# Patient Record
Sex: Female | Born: 1980 | Hispanic: Yes | Marital: Single | Smoking: Never smoker
Health system: Southern US, Community
[De-identification: ages and names within clinical notes are randomized; demographics above are authoritative.]

## PROBLEM LIST (undated history)

## (undated) DIAGNOSIS — Z789 Other specified health status: Secondary | ICD-10-CM

## (undated) HISTORY — PX: OTHER SURGICAL HISTORY: SHX169

---

## 2009-09-08 ENCOUNTER — Emergency Department: Payer: Self-pay | Admitting: Emergency Medicine

## 2009-12-23 ENCOUNTER — Encounter: Payer: Self-pay | Admitting: Obstetrics & Gynecology

## 2010-01-02 ENCOUNTER — Encounter: Payer: Self-pay | Admitting: Obstetrics & Gynecology

## 2010-05-19 ENCOUNTER — Inpatient Hospital Stay: Payer: Self-pay | Admitting: Obstetrics and Gynecology

## 2015-02-06 ENCOUNTER — Other Ambulatory Visit: Payer: Self-pay | Admitting: Advanced Practice Midwife

## 2015-02-06 DIAGNOSIS — Z369 Encounter for antenatal screening, unspecified: Secondary | ICD-10-CM

## 2015-03-03 ENCOUNTER — Encounter: Payer: Self-pay | Admitting: Emergency Medicine

## 2015-03-03 ENCOUNTER — Emergency Department: Payer: Medicaid Other

## 2015-03-03 ENCOUNTER — Emergency Department
Admission: EM | Admit: 2015-03-03 | Discharge: 2015-03-03 | Disposition: A | Discharge status: Home / Self Care | Payer: Medicaid Other | Attending: Emergency Medicine | Admitting: Emergency Medicine

## 2015-03-03 DIAGNOSIS — O209 Hemorrhage in early pregnancy, unspecified: Secondary | ICD-10-CM

## 2015-03-03 DIAGNOSIS — O039 Complete or unspecified spontaneous abortion without complication: Secondary | ICD-10-CM | POA: Diagnosis not present

## 2015-03-03 DIAGNOSIS — Z3A10 10 weeks gestation of pregnancy: Secondary | ICD-10-CM | POA: Diagnosis not present

## 2015-03-03 DIAGNOSIS — N939 Abnormal uterine and vaginal bleeding, unspecified: Secondary | ICD-10-CM

## 2015-03-03 DIAGNOSIS — R102 Pelvic and perineal pain: Secondary | ICD-10-CM

## 2015-03-03 DIAGNOSIS — O26899 Other specified pregnancy related conditions, unspecified trimester: Secondary | ICD-10-CM

## 2015-03-03 LAB — CBC
HCT: 40.1 % (ref 35.0–47.0)
HEMOGLOBIN: 13.4 g/dL (ref 12.0–16.0)
MCH: 30.3 pg (ref 26.0–34.0)
MCHC: 33.5 g/dL (ref 32.0–36.0)
MCV: 90.3 fL (ref 80.0–100.0)
PLATELETS: 227 10*3/uL (ref 150–440)
RBC: 4.44 MIL/uL (ref 3.80–5.20)
RDW: 13.3 % (ref 11.5–14.5)
WBC: 10.3 10*3/uL (ref 3.6–11.0)

## 2015-03-03 LAB — ABO/RH: ABO/RH(D): O POS

## 2015-03-03 LAB — URINALYSIS COMPLETE WITH MICROSCOPIC (ARMC ONLY)
BILIRUBIN URINE: NEGATIVE
Glucose, UA: NEGATIVE mg/dL
KETONES UR: NEGATIVE mg/dL
LEUKOCYTES UA: NEGATIVE
Nitrite: NEGATIVE
PH: 6 (ref 5.0–8.0)
PROTEIN: 100 mg/dL — AB
Specific Gravity, Urine: 1.006 (ref 1.005–1.030)

## 2015-03-03 LAB — POCT PREGNANCY, URINE: Preg Test, Ur: POSITIVE — AB

## 2015-03-03 LAB — TYPE AND SCREEN
ABO/RH(D): O POS
ANTIBODY SCREEN: NEGATIVE

## 2015-03-03 LAB — HCG, QUANTITATIVE, PREGNANCY: HCG, BETA CHAIN, QUANT, S: 12923 m[IU]/mL — AB (ref <5)

## 2015-03-03 NOTE — ED Notes (Signed)
Patient speaks minimal English.  Pt understood that we will be paging interpreter when MD is ready.

## 2015-03-03 NOTE — ED Notes (Signed)
Resources given to follow up with OB/GYN or health department. Interpreter at bedside for clarification of instructions.

## 2015-03-03 NOTE — ED Notes (Signed)
POCT UA Preg POSITVE

## 2015-03-03 NOTE — ED Notes (Signed)
Pt reports she is approximately [redacted] weeks pregnant and yesterday she noticed some vaginal discharge with blood. Pt reports today she saw a blood clot, pt reports lower abdominal pain. Pt denies any other symptom at present. Pt talks in complete sentences no respiratory distress noted.

## 2015-03-03 NOTE — ED Provider Notes (Signed)
[facility] Emergency Department Provider Note  ____________________________________________  Time seen: 1:45pm  I have reviewed the triage vital signs and the nursing notes.   HISTORY  Chief Complaint Vaginal Bleeding seen with spanish interp   HPI Brianna Conner is a 34 y.o. female who complains of vag bleeding since yesterday.  Also cramping pelvic pain.  No f/c/n/v/cp/sob. No dizzy/syncope. Colicky pain, mod. Intensity.    pnc at health dept. Take pnv.      History reviewed. No pertinent past medical history.   There are no active problems to display for this patient.    Past Surgical History  Procedure Laterality Date  . Arm surgery  Right      No current outpatient prescriptions on file. Prenatal vits.   Allergies Review of patient's allergies indicates not on file.   No family history on file.  Social History Social History  Substance Use Topics  . Smoking status: Never Smoker   . Smokeless tobacco: None  . Alcohol Use: No    Review of Systems  Constitutional:   No fever or chills. No weight changes Eyes:   No blurry vision or double vision.  ENT:   No sore throat. Cardiovascular:   No chest pain. Respiratory:   No dyspnea or cough. Gastrointestinal:   Pelvic pain without vomiting and diarrhea.  No BRBPR or melena. Genitourinary:   Negative for dysuria, urinary retention, bloody urine, or difficulty urinating. Musculoskeletal:   Negative for back pain. No joint swelling or pain. Skin:   Negative for rash. Neurological:   Negative for headaches, focal weakness or numbness. Psychiatric:  No anxiety or depression.   Endocrine:  No hot/cold intolerance, changes in energy, or sleep difficulty.  10-point ROS otherwise negative.  ____________________________________________   PHYSICAL EXAM:  VITAL SIGNS: ED Triage Vitals  Enc Vitals Group     BP 03/03/15 1126 123/76 mmHg     Pulse Rate 03/03/15 1126 80      Resp 03/03/15 1126 20     Temp 03/03/15 1126 97.8 F (36.6 C)     Temp Source 03/03/15 1126 Oral     SpO2 03/03/15 1126 99 %     Weight 03/03/15 1126 155 lb (70.308 kg)     Height 03/03/15 1126  (1.6 m)     Head Cir --      Peak Flow --      Pain Score 03/03/15 1127 5     Pain Loc --      Pain Edu? --      Excl. in GC? --      Constitutional:   Alert and oriented. Well appearing and in no distress. Eyes:   No scleral icterus. No conjunctival pallor. PERRL. EOMI ENT   Head:   Normocephalic and atraumatic.   Nose:   No congestion/rhinnorhea. No septal hematoma   Mouth/Throat:   MMM, no pharyngeal erythema. No peritonsillar mass. No uvula shift.   Neck:   No stridor. No SubQ emphysema. No meningismus. Hematological/Lymphatic/Immunilogical:   No cervical lymphadenopathy. Cardiovascular:   RRR. Normal and symmetric distal pulses are present in all extremities. No murmurs, rubs, or gallops. Respiratory:   Normal respiratory effort without tachypnea nor retractions. Breath sounds are clear and equal bilaterally. No wheezes/rales/rhonchi. Gastrointestinal:   Soft with diffuse lower abd pain. Worse suprapubic. Marland Kitchen No distention. There is no CVA tenderness.  No rebound, rigidity, or guarding. Genitourinary:   deferred Musculoskeletal:   Nontender with normal range of motion  in all extremities. No joint effusions.  No lower extremity tenderness.  No edema. Neurologic:   Normal speech and language.  CN 2-10 normal. Motor grossly intact. No pronator drift.  Normal gait. No gross focal neurologic deficits are appreciated.  Skin:    Skin is warm, dry and intact. No rash noted.  No petechiae, purpura, or bullae. Psychiatric:   Mood and affect are normal. Speech and behavior are normal. Patient exhibits appropriate insight and judgment.  ____________________________________________    LABS (pertinent positives/negatives) (all labs ordered are listed, but only abnormal  results are displayed) Labs Reviewed  URINALYSIS COMPLETEWITH MICROSCOPIC ([facility] ONLY) - Abnormal; Notable for the following:    Color, Urine RED (*)    APPearance CLEAR (*)    Hgb urine dipstick 3+ (*)    Protein, ur 100 (*)    Bacteria, UA RARE (*)    Squamous Epithelial / LPF 0-5 (*)    All other components within normal limits  POCT PREGNANCY, URINE - Abnormal; Notable for the following:    Preg Test, Ur POSITIVE (*)    All other components within normal limits  CBC  HCG, QUANTITATIVE, PREGNANCY  POC URINE PREG, ED  TYPE AND SCREEN  ABO/RH   ____________________________________________   EKG    ____________________________________________    RADIOLOGY  US OB/pelvis pending  ____________________________________________   PROCEDURES   ____________________________________________   INITIAL IMPRESSION / ASSESSMENT AND PLAN / ED COURSE  Pertinent labs & imaging results that were available during my care of the patient were reviewed by me and considered in my medical decision making (see chart for details).  Pt p/w vaginal bleeding in second trimester. No prior US this preg.  Will start with [place] to r/o previa, f/u OB if live IUP.  Type is 0+, no rhogam needed.    Care of pt signed out to oncoming physician Dr. Shaune Pollack at 15:00     ____________________________________________   FINAL CLINICAL IMPRESSION(S) / ED DIAGNOSES  Final diagnoses:  Vaginal bleeding before [redacted] weeks gestation      Sharman Cheek, MD 03/03/15 1506

## 2015-03-03 NOTE — ED Provider Notes (Addendum)
Radiology  Ultrasound pelvic:  IMPRESSION: Large misshapen intrauterine gestational sac without demonstrated fetal pole. Findings meet definitive criteria for failed pregnancy. This follows SRU consensus guidelines: Diagnostic Criteria for Nonviable Pregnancy Early in the First Trimester. [facility] [phone].    I discussed the result of failed pregnancy with the patient and family. We discussed return precautions with respect to bleeding and pain. Patient was referred to follow-up with either the health department or OB/GYN.    Governor Rooks, MD 03/03/15 1705  Governor Rooks, MD 03/03/15 [phone]

## 2015-03-03 NOTE — ED Notes (Signed)
Patient reports yesterday she had a little bit of blood yesterday and today she started noticing rings that looked like rubber coming from vagina and was increasing. She is seen at the health department for prenatal care. Currently taking prenatal vitamins.  She has not had any ultrasounds done yet.  C/o pain in lower abdomen with palpation.

## 2015-03-03 NOTE — Discharge Instructions (Signed)
Aborto incompleto °(Incomplete Miscarriage) °Un aborto espontáneo es la pérdida repentina de un bebé en gestación (feto) antes de la semana 20 del embarazo. En un aborto espontáneo, partes del feto o la placenta (alumbramiento) permanecen en el cuerpo.  °El aborto espontáneo puede ser una experiencia que afecte emocionalmente a la persona. Hable con su médico si tiene preguntas sobre el aborto espontáneo, el proceso de duelo y los planes futuros de embarazo. °CAUSAS  °· Algunos problemas cromosómicos pueden hacer imposible que el bebé se desarrolle normalmente. Los problemas con los genes o cromosomas del bebé son, en la mayoría de los casos, el resultado de errores que se producen, al azar, cuando el embrión se divide y crece. Estos problemas no se heredan de los padres. °· Infección en el cuello del útero. °· Problemas hormonales. °· Problemas en el cuello del útero, como tener un útero incompetente. Esto ocurre cuando los tejidos no son lo suficientemente fuertes como para contener el embarazo. °· Problemas del útero, como un útero con forma anormal, los fibromas o anormalidades congénitas. °· Ciertas enfermedades crónicas. °· No fume, no beba alcohol, ni consuma drogas. °· Traumatismos. °SÍNTOMAS  °· Sangrado o manchado vaginal, con o sin cólicos o dolor. °· Dolor o cólicos en el abdomen o en la cintura. °· Eliminación de líquido, tejidos o coágulos grandes por la vagina. °DIAGNÓSTICO  °El médico le hará un examen físico. También le indicará una ecografía para confirmar el aborto. Es posible que se realicen análisis de sangre. °TRATAMIENTO  °· Generalmente se realiza un procedimiento de dilatación y curetaje (D y C). Durante el procedimiento de dilatación y curetaje, el cuello del útero se abre (dilata) y se retira todo resto de tejido fetal o placentario del útero. °· Si hay una infección, le recetarán antibióticos. Posiblemente le receten otros medicamentos para reducir (contraer) el tamaño del útero si hay  mucha hemorragia. °· Si su tipo de sangre es Rh negativo y el del bebé es Rh positivo, necesitará una inyección de inmunoglobulina Rho(D). Esta inyección protegerá a los futuros bebés de tener problemas de compatibilidad Rh en futuros embarazos. °· Probablemente le indiquen reposo. Esto significa que debe quedarse en cama y levantarse únicamente para ir al baño. °INSTRUCCIONES PARA EL CUIDADO EN EL HOGAR  °· Haga reposo según las indicaciones del médico. °· Limite las actividades según las indicaciones del médico. Es posible que se le permita retomar las actividades livianas si no se le realizó un curetaje, pero necesitará tratamiento adicional. °· Lleve un registro de la cantidad de toallas sanitarias que usa por día. Observe cuán impregnadas (saturadas) están. Registre esta información. °· No  use tampones. °· No se haga duchas vaginales ni tenga relaciones sexuales hasta que el médico la autorice. °· Asista a todas las citas de seguimiento para una nueva evaluación y para continuar el tratamiento. °· Sólo tome medicamentos de venta libre o recetados para calmar el dolor, el malestar o bajar la fiebre, según las indicaciones de su médico. °· Tome los antibióticos como le indicó el médico. Asegúrese de que finaliza la prescripción completa aunque se sienta mejor. °SOLICITE ATENCIÓN MÉDICA DE INMEDIATO SI:  °· Siente calambres intensos en el estómago, en la espalda o en el abdomen. °· Le sube la fiebre sin motivo (asegúrese de registrar las cifras). °· Elimina coágulos grandes o tejidos (consérvelos para que el médico los analice). °· La hemorragia aumenta. °· Se siente mareada, débil o tiene episodios de desmayo. °ASEGÚRESE DE QUE:  °· Comprende estas instrucciones. °·   Controlará su afección. °· Recibirá ayuda de inmediato si no mejora o si empeora. °Document Released: 05/25/2005 Document Revised: 03/15/2013 °ExitCare® Patient Information ©2015 [employer]. This information is not intended to replace advice given  to you by your health care provider. Make sure you discuss any questions you have with your health care provider. ° °

## 2015-03-06 ENCOUNTER — Encounter: Payer: Self-pay | Admitting: Emergency Medicine

## 2015-03-06 ENCOUNTER — Emergency Department
Admission: EM | Admit: 2015-03-06 | Discharge: 2015-03-06 | Disposition: A | Discharge status: Home / Self Care | Payer: Medicaid Other | Attending: Emergency Medicine | Admitting: Emergency Medicine

## 2015-03-06 DIAGNOSIS — Z3A11 11 weeks gestation of pregnancy: Secondary | ICD-10-CM | POA: Diagnosis not present

## 2015-03-06 DIAGNOSIS — O039 Complete or unspecified spontaneous abortion without complication: Secondary | ICD-10-CM | POA: Insufficient documentation

## 2015-03-06 DIAGNOSIS — O209 Hemorrhage in early pregnancy, unspecified: Secondary | ICD-10-CM | POA: Diagnosis present

## 2015-03-06 LAB — URINALYSIS COMPLETE WITH MICROSCOPIC (ARMC ONLY)
Bacteria, UA: NONE SEEN
SPECIFIC GRAVITY, URINE: 1.018 (ref 1.005–1.030)

## 2015-03-06 LAB — COMPREHENSIVE METABOLIC PANEL
ALT: 16 U/L (ref 14–54)
ANION GAP: 10 (ref 5–15)
AST: 31 U/L (ref 15–41)
Albumin: 3.7 g/dL (ref 3.5–5.0)
Alkaline Phosphatase: 71 U/L (ref 38–126)
BUN: 12 mg/dL (ref 6–20)
CHLORIDE: 104 mmol/L (ref 101–111)
CO2: 22 mmol/L (ref 22–32)
CREATININE: 0.63 mg/dL (ref 0.44–1.00)
Calcium: 9.4 mg/dL (ref 8.9–10.3)
Glucose, Bld: 121 mg/dL — ABNORMAL HIGH (ref 65–99)
POTASSIUM: 3.8 mmol/L (ref 3.5–5.1)
SODIUM: 136 mmol/L (ref 135–145)
Total Bilirubin: 0.4 mg/dL (ref 0.3–1.2)
Total Protein: 7.4 g/dL (ref 6.5–8.1)

## 2015-03-06 LAB — CBC WITH DIFFERENTIAL/PLATELET
Basophils Absolute: 0.1 10*3/uL (ref 0–0.1)
Basophils Relative: 1 %
EOS ABS: 0.1 10*3/uL (ref 0–0.7)
Eosinophils Relative: 0 %
HEMATOCRIT: 38.8 % (ref 35.0–47.0)
HEMOGLOBIN: 12.9 g/dL (ref 12.0–16.0)
LYMPHS ABS: 1.5 10*3/uL (ref 1.0–3.6)
LYMPHS PCT: 11 %
MCH: 30.4 pg (ref 26.0–34.0)
MCHC: 33.3 g/dL (ref 32.0–36.0)
MCV: 91.3 fL (ref 80.0–100.0)
Monocytes Absolute: 0.5 10*3/uL (ref 0.2–0.9)
Monocytes Relative: 3 %
NEUTROS ABS: 11.9 10*3/uL — AB (ref 1.4–6.5)
NEUTROS PCT: 85 %
Platelets: 238 10*3/uL (ref 150–440)
RBC: 4.25 MIL/uL (ref 3.80–5.20)
RDW: 13.4 % (ref 11.5–14.5)
WBC: 14.1 10*3/uL — AB (ref 3.6–11.0)

## 2015-03-06 LAB — HCG, QUANTITATIVE, PREGNANCY: HCG, BETA CHAIN, QUANT, S: 4866 m[IU]/mL — AB (ref <5)

## 2015-03-06 MED ORDER — SODIUM CHLORIDE 0.9 % IV BOLUS (SEPSIS)
500.0000 mL | Freq: Once | INTRAVENOUS | Status: AC
  Administered 2015-03-06: 500 mL via INTRAVENOUS

## 2015-03-06 NOTE — ED Notes (Signed)
Called lab again to verify blood work received.

## 2015-03-06 NOTE — ED Notes (Signed)
Pt to ed from health department for heavy bleeding,  Per health dept, pt was told she was having a miscarriage on the 25th of sept.  Pt went there today for follow up with heavy vaginal bleeding, soaking through two pads per hour and severe pain.

## 2015-03-06 NOTE — ED Notes (Signed)
Patient reports seeing health department today after heavy bleeding (2 pads per hour). Was told on 9/25 she was having miscarriage. Patient states she was approximately [redacted] weeks pregnant. Patient states she has very minimal pain currently.

## 2015-03-06 NOTE — ED Provider Notes (Addendum)
[facility] Emergency Department Provider Note  ____________________________________________  Time seen: Approximately 4:28 PM  I have reviewed the triage vital signs and the nursing notes.   HISTORY  Chief Complaint Vaginal Bleeding    HPI Brianna Conner is a 34 y.o. female who presents today complaining of having had vaginal bleeding earlier today. Patient was seen here on the 25th of this month, 3 days ago, and diagnosed with fetal demise. She is G2 P1 and her last missed her period was July 4 making her 12 and 2 at this time. She was sent home with bleeding precautions and was fine until this morning when she went to her primary care doctor. They did a pelvic exam and the patient thereafter began to bleed. The patient stated that she had significant bleeding approximately 8 pads an hour passed a few large clots but now the bleeding is stopped or nearly so and the cramping is nearly gone. She was sent here because it was thought that she was having a miscarriage.      History reviewed. No pertinent past medical history.  There are no active problems to display for this patient.   Past Surgical History  Procedure Laterality Date  . Arm surgery  Right     No current outpatient prescriptions on file.  Allergies Review of patient's allergies indicates no known allergies.  History reviewed. No pertinent family history.  Social History Social History  Substance Use Topics  . Smoking status: Never Smoker   . Smokeless tobacco: None  . Alcohol Use: No    Review of Systems Constitutional: No fever/chills Eyes: No visual changes. ENT: No sore throat. No stiff neck no neck pain Cardiovascular: Denies chest pain. Respiratory: Denies shortness of breath. Gastrointestinal:   no vomiting.  No diarrhea.  No constipation. Genitourinary: Negative for dysuria. Musculoskeletal: Negative lower extremity swelling Skin: Negative for  rash. Neurological: Negative for headaches, focal weakness or numbness. 10-point ROS otherwise negative.  ____________________________________________   PHYSICAL EXAM:  VITAL SIGNS: ED Triage Vitals  Enc Vitals Group     BP 03/06/15 1144 112/75 mmHg     Pulse Rate 03/06/15 1144 100     Resp 03/06/15 1144 20     Temp 03/06/15 1144 98.2 F (36.8 C)     Temp Source 03/06/15 1144 Oral     SpO2 03/06/15 1144 100 %     Weight --      Height --      Head Cir --      Peak Flow --      Pain Score 03/06/15 1145 9     Pain Loc --      Pain Edu? --      Excl. in GC? --     Constitutional: Alert and oriented. Well appearing and in no acute distress. Eyes: Conjunctivae are normal. PERRL. EOMI. Head: Atraumatic. Nose: No congestion/rhinnorhea. Mouth/Throat: Mucous membranes are moist.  Oropharynx non-erythematous. Neck: No stridor.   Nontender with no meningismus Cardiovascular: Normal rate, regular rhythm. Grossly normal heart sounds.  Good peripheral circulation. Respiratory: Normal respiratory effort.  No retractions. Lungs CTAB. Gastrointestinal: Soft and  Mildly tender to palpation in the lower abdomen, no guarding no rebound s no abdominal distention not a surgical abdomen no evidence of hemoperitoneum   Back:  There is no focal tenderness or step off there is no midline tenderness there are no lesions noted. there is ntenderness  pelvic exam:  female chaperone present, there is scant  amounts of blood in the vault, her os is open to fingertip, there is no adnexal tenderness there is some uterine tenderness there is no vaginal discharge there are no lesions.  Musculoskeletal: No lower extremity tenderness. No joint effusions, no DVT signs strong distal pulses no edema Neurologic:  Normal speech and language. No gross focal neurologic deficits are appreciated.  Skin:  Skin is warm, dry and intact. No rash noted. Psychiatric: Mood and affect are normal. Speech and behavior are  normal.  ____________________________________________   LABS (all labs ordered are listed, but only abnormal results are displayed)  Labs Reviewed  HCG, QUANTITATIVE, PREGNANCY - Abnormal; Notable for the following:    hCG, Beta Chain, Quant, S 4866 (*)    All other components within normal limits  URINALYSIS COMPLETEWITH MICROSCOPIC ([facility] ONLY)  COMPREHENSIVE METABOLIC PANEL  CBC WITH DIFFERENTIAL/PLATELET  POC URINE PREG, ED  TYPE AND SCREEN   ____________________________________________  EKG   ____________________________________________  RADIOLOGY   ____________________________________________   PROCEDURES  Procedure(s) performed: None  Critical Care performed: None  ____________________________________________   INITIAL IMPRESSION / ASSESSMENT AND PLAN / ED COURSE  Pertinent labs & imaging results that were available during my care of the patient were reviewed by me and considered in my medical decision making (see chart for details). patient here with a Sharene Butters is now 49, was [id number]  when last checked. Ultrasound and prior workup did suggest that the patient had an inevitable miscarriage and at this time that is the most likely scenario. She does not have peritoneal signs and her abdomen. There's nothing to suggest internal bleeding at this time her pain is much better controlled and she is no longer bleeding any significant way. We'll recheck an H&H given how much blood she apparently was having earlier today but I reassured by her vital signs thus far, I'm giving her a small bolus of fluid and we will monitor her closely.   ----------------------------------------- 7:08 PM on 03/06/2015 -----------------------------------------  There was a delay in the laboratory, her blood work is reassuring, she has had no persistent abdominal pain, serial abdominal exams are negative, she has had no significant bleeding while she is here, I will discuss with OB whom I  have paged OB 2, and we will discharge her home if they agree with the plan.  _______________________________________  ----------------------------------------- 8:01 PM on 03/06/2015 -----------------------------------------  The patient remains asystematic at this time discussed with OB Dr. Valentino Saxon who does not wish further ultrasound agrees with management and with discharge.    FINAL CLINICAL IMPRESSION(S) / ED DIAGNOSES  Final diagnoses:  None     Jeanmarie Plant, MD 03/06/15 1908  Jeanmarie Plant, MD 03/06/15 2001

## 2015-03-06 NOTE — Discharge Instructions (Signed)
Aborto espontáneo  °(Miscarriage) ° El aborto espontáneo es la pérdida de un bebé que no ha nacido.(feto) antes de la semana 20 del embarazo. La causa generalmente es desconocida.  °CUIDADOS EN EL HOGAR  °· Debe permanecer en cama (reposo en cama) o podrá hacer actividades livianas. Regrese a sus actividades según las indicaciones del médico. °· Pida ayuda con las tareas domésticas. °· Anote cuántos apósitos usa por día. Describa el grado en que están empapados. °· No use tampones. No se higienice la vagina (duchas vaginales) ni tenga relaciones sexuales (coito) hasta que el médico la autorice. °· Sólo debe tomar la medicación según las indicaciones del médico. °· No tome aspirina. °· Cumpla con los controles médicos según las indicaciones. °· Si usted o su pareja tienen problemas con el duelo, hable con su médico. También puede intentar con psicoterapia. Permítase el tiempo suficiente de duelo antes de quedar embarazada nuevamente. °SOLICITE AYUDA DE INMEDIATO SI:  °· Siente cólicos intensos o dolor en el estómago, en la espalda o en el vientre (abdomen). °· Tiene fiebre. °· Elimina grumos de sangre (coágulos) por la vagina, que tienen el tamaño de una nuez o más. Guarde los coágulos para que el médico los vea. °· Elimina gran cantidad de tejidos por la vagina. Guarde lo que ha eliminado para que su médico lo examine. °· Aumenta el sangrado. °· Observa una secreción espesa, con mal olor (pérdida) que proviene de la vagina. °· Se siente mareada, débil o se desvanece (se desmaya). °· Siente escalofríos. °ASEGÚRESE DE QUE:  °· Comprende estas instrucciones. °· Controlará su enfermedad. °· Solicitará ayuda de inmediato si no mejora o si empeora. °Document Released: 11/24/2011 °ExitCare® Patient Information ©2015 [employer]. This information is not intended to replace advice given to you by your health care provider. Make sure you discuss any questions you have with your health care provider. ° °

## 2015-03-07 ENCOUNTER — Ambulatory Visit: Payer: Self-pay

## 2016-05-08 ENCOUNTER — Other Ambulatory Visit: Payer: Self-pay | Admitting: Advanced Practice Midwife

## 2016-05-08 DIAGNOSIS — Z369 Encounter for antenatal screening, unspecified: Secondary | ICD-10-CM

## 2016-05-14 ENCOUNTER — Ambulatory Visit: Payer: Medicaid Other

## 2016-05-14 ENCOUNTER — Ambulatory Visit
Admission: RE | Admit: 2016-05-14 | Discharge: 2016-05-14 | Disposition: A | Discharge status: Home / Self Care | Payer: Medicaid Other | Source: Ambulatory Visit | Attending: Obstetrics and Gynecology | Admitting: Obstetrics and Gynecology

## 2016-05-14 ENCOUNTER — Ambulatory Visit (HOSPITAL_BASED_OUTPATIENT_CLINIC_OR_DEPARTMENT_OTHER)
Admission: RE | Admit: 2016-05-14 | Discharge: 2016-05-14 | Disposition: A | Discharge status: Home / Self Care | Payer: Medicaid Other | Source: Ambulatory Visit | Attending: Obstetrics and Gynecology | Admitting: Obstetrics and Gynecology

## 2016-05-14 VITALS — BP 121/68 | HR 83 | Temp 98.2°F | Resp 18 | Wt 159.2 lb

## 2016-05-14 DIAGNOSIS — O26849 Uterine size-date discrepancy, unspecified trimester: Secondary | ICD-10-CM | POA: Insufficient documentation

## 2016-05-14 DIAGNOSIS — O09511 Supervision of elderly primigravida, first trimester: Secondary | ICD-10-CM | POA: Insufficient documentation

## 2016-05-14 DIAGNOSIS — Z369 Encounter for antenatal screening, unspecified: Secondary | ICD-10-CM | POA: Insufficient documentation

## 2016-05-14 DIAGNOSIS — O09529 Supervision of elderly multigravida, unspecified trimester: Secondary | ICD-10-CM

## 2016-05-14 DIAGNOSIS — Z3A09 9 weeks gestation of pregnancy: Secondary | ICD-10-CM | POA: Insufficient documentation

## 2016-05-14 HISTORY — DX: Other specified health status: Z78.9

## 2016-05-14 NOTE — Progress Notes (Addendum)
Referring Provider:   [facility] Length of Consultation: 50 minutes  Ms. Vallarie Marespinoza Roldan was referred to [facility] for genetic counseling because of advanced maternal age.  The patient will be 35 years old at the time of delivery.  This note summarizes the information we discussed.    We explained that the chance of a chromosome abnormality increases with maternal age.  Chromosomes and examples of chromosome problems were reviewed.  Humans typically have 46 chromosomes in each cell, with half passed through each sperm and egg.  Any change in the number or structure of chromosomes can increase the risk of problems in the physical and mental development of a pregnancy.   Based upon age of the patient and the current gestational age, the chance of any chromosome abnormality was 34 in 62114. The chance of Down syndrome, the most common chromosome problem associated with maternal age, was 34 in 60238.  The risk of chromosome problems is in addition to the 3% general population risk for birth defects and mental retardation.  The greatest chance, of course, is that the baby would be born in good health.  We discussed the following prenatal screening and testing options for this pregnancy:  First trimester screening, which includes nuchal translucency ultrasound screen and first trimester maternal serum marker screening.  The nuchal translucency has approximately an 80% detection rate for Down syndrome and can be positive for other chromosome abnormalities as well as heart defects.  When combined with a maternal serum marker screening, the detection rate is up to 90% for Down syndrome and up to 97% for trisomy 18.     The chorionic villus sampling procedure is available for first trimester chromosome analysis.  This involves the withdrawal of a small amount of chorionic villi (tissue from the developing placenta).  Risk of pregnancy loss is estimated to be approximately 1 in  200 to 1 in 100 (0.5 to 1%).  There is approximately a 1% (1 in 100) chance that the CVS chromosome results will be unclear.  Chorionic villi cannot be tested for neural tube defects.     Maternal serum marker screening, a blood test that measures pregnancy proteins, can provide risk assessments for Down syndrome, trisomy 18, and open neural tube defects (spina bifida, anencephaly). Because it does not directly examine the fetus, it cannot positively diagnose or rule out these problems.  Targeted ultrasound uses high frequency sound waves to create an image of the developing fetus.  An ultrasound is often recommended as a routine means of evaluating the pregnancy.  It is also used to screen for fetal anatomy problems (for example, a heart defect) that might be suggestive of a chromosomal or other abnormality.   Amniocentesis involves the removal of a small amount of amniotic fluid from the sac surrounding the fetus with the use of a thin needle inserted through the maternal abdomen and uterus.  Ultrasound guidance is used throughout the procedure.  Fetal cells from amniotic fluid are directly evaluated and > 99.5% of chromosome problems and > 98% of open neural tube defects can be detected. This procedure is generally performed after the 15th week of pregnancy.  The main risks to this procedure include complications leading to miscarriage in less than 1 in 200 cases (0.5%).  We also reviewed the availability of cell free fetal DNA testing from maternal blood to determine whether or not the baby may have either Down syndrome, trisomy 4313, or trisomy 4418.  This test  utilizes a maternal blood sample and DNA sequencing technology to isolate circulating cell free fetal DNA from maternal plasma.  The fetal DNA can then be analyzed for DNA sequences that are derived from the three most common chromosomes involved in aneuploidy, chromosomes 13, 18, and 21.  If the overall amount of DNA is greater than the expected  level for any of these chromosomes, aneuploidy is suspected.  While we do not consider it a replacement for invasive testing and karyotype analysis, a negative result from this testing would be reassuring, though not a guarantee of a normal chromosome complement for the baby.  An abnormal result is certainly suggestive of an abnormal chromosome complement, though we would still recommend CVS or amniocentesis to confirm any findings from this testing.  Cystic Fibrosis and Spinal Muscular Atrophy (SMA) screening were also discussed with the patient. Both conditions are recessive, which means that both parents must be carriers in order to have a child with the disease.  Cystic fibrosis (CF) is one of the most common genetic conditions in persons of Caucasian ancestry.  This condition occurs in approximately 1 in 2,500 Caucasian persons and results in thickened secretions in the lungs, digestive, and reproductive systems.  For a baby to be at risk for having CF, both of the parents must be carriers for this condition.  Approximately 1 in 1525 Caucasian persons is a carrier for CF.  Current carrier testing looks for the most common mutations in the gene for CF and can detect approximately 90% of carriers in the Caucasian population.  This means that the carrier screening can greatly reduce, but cannot eliminate, the chance for an individual to have a child with CF.  If an individual is found to be a carrier for CF, then carrier testing would be available for the partner. As part of [place]'s newborn screening profile, all babies born in the state of [place] will have a two-tier screening process.  Specimens are first tested to determine the concentration of immunoreactive trypsinogen (IRT).  The top 5% of specimens with the highest IRT values then undergo DNA testing using a panel of over 40 common CF mutations. SMA is a neurodegenerative disorder that leads to atrophy of skeletal muscle and overall  weakness.  This condition is also more prevalent in the Caucasian population, with 1 in 40-1 in 60 persons being a carrier and 1 in 6,000-1 in 10,000 children being affected.  There are multiple forms of the disease, with some causing death in infancy to other forms with survival into adulthood.  The genetics of SMA is complex, but carrier screening can detect up to 95% of carriers in the Caucasian population.  Similar to CF, a negative result can greatly reduce, but cannot eliminate, the chance to have a child with SMA.  We obtained a detailed family history and pregnancy history.  The patient reported one paternal uncle with limb abnormalities.  He has one arm that is small and both legs are small, though fully formed.  This has been since birth and has not been progressive.  He is said to have normal intelligence and no other physical differences.  We discussed that limb abnormalities can occur for various reasons including some which are inherited and others that are not.  Without additional medical information or medical records to review, it is difficult to determine the chance for other family members to have a similar condition.   The remainder of the family history is unremarkable for birth  defects, mental retardation, recurrent pregnancy loss or known chromosome abnormalities.  Ms. Tiffine Henigan reported that this is her third pregnancy.  She and her partner, Margurite Auerbach, have one 35 year old son who is healthy and had one early miscarriage.  She reported no complications or exposures in this pregnancy that would be expected to increase the risk for birth defects.  After consideration of the options, Ms. Diedra Sinor elected to proceed with an ultrasound and desires cell free DNA testing.  She declined CF and SMA carrier screening.  Given the ultrasound dating, she was scheduled to return for cell free DNA testing only in 3 weeks and for an anatomy ultrasound in 9 weeks.  An ultrasound was  performed at the time of the visit.  The gestational age was consistent with  9 weeks.  Fetal anatomy could not be assessed due to early gestational age.  Please refer to the ultrasound report for details of that study.  Ms. Markeria Goetsch was encouraged to call with questions or concerns.  We can be contacted at [phone].   Cherly Anderson, MS, CGC  I saw the pt and reviewed the plan with an interpreter  We will have her return in 2-3 weeks for blood draw for cell free DNA  Anatomy scan at end of January Jimmey Ralph, MD

## 2016-05-18 ENCOUNTER — Ambulatory Visit (HOSPITAL_COMMUNITY): Payer: Medicaid Other

## 2016-06-04 ENCOUNTER — Other Ambulatory Visit: Payer: Self-pay

## 2016-06-04 ENCOUNTER — Ambulatory Visit
Admission: RE | Admit: 2016-06-04 | Discharge: 2016-06-04 | Disposition: A | Discharge status: Home / Self Care | Payer: Medicaid Other | Source: Ambulatory Visit | Attending: Maternal and Fetal Medicine | Admitting: Maternal and Fetal Medicine

## 2016-06-04 DIAGNOSIS — O09529 Supervision of elderly multigravida, unspecified trimester: Secondary | ICD-10-CM

## 2016-06-08 NOTE — L&D Delivery Note (Signed)
Delivery Note At 1:27 PM a viable and healthy female "Brianna Conner" was delivered via Vaginal, Spontaneous Delivery (Presentation:direct OA ;  ).  APGAR: 8,9 ; weight pending.    Anesthesia:  local Episiotomy:  none Lacerations:  Left labia Suture Repair: 3.0 vicryl Est. Blood Loss (mL):  200  Mom to postpartum.  Baby to Couplet care / Skin to Skin.  G3P1011 at 39+2wks by LMP and 17wk ultrasound presented in active labor and quickly progressed to fully dilated. She pushed over an intact perineum without anesthisea and delivered a viable healthy female infant with a short umbilical cord, but no nuchal cord. Cord clamped and cut by FOB at the perineum and baby passed to maternal abdomen. He was vigorous and crying immediately. Small left labial laceration and left hymenal laceration repaired under local anesthesia with simple interrupted stiches x4. Placenta delivered intact and fundus firm. Postpartum pitocin running.  Christeen DouglasBethany Donnivan Villena 12/08/2016, 1:52 PM

## 2016-06-12 ENCOUNTER — Telehealth: Payer: Self-pay | Admitting: Obstetrics and Gynecology

## 2016-06-12 NOTE — Telephone Encounter (Signed)
The patient was informed of the results of her recent InformaSeq testing (performed at [facility]) which yielded NEGATIVE results.  The patient's specimen showed DNA consistent with two copies of chromosomes 21, 18 and 13.  The sensitivity for trisomy 21, trisomy 18 and trisomy 13 using this testing are reported as 99.1%, 98.3% and 98.1% respectively.  Thus, while the results of this testing are highly accurate, they are not considered diagnostic at this time.  Should more definitive information be desired, the patient may still consider amniocentesis.   As requested to know by the patient, sex chromosome analysis was included for this sample.  Results was consistent with a female (XY)  fetus. This is predicted with >97% accuracy.  A maternal serum AFP only should be considered if screening for neural tube defects is desired.  Yenny Kosa F. Tena Linebaugh, MS, CGC   

## 2016-07-02 ENCOUNTER — Other Ambulatory Visit: Payer: Self-pay | Admitting: *Deleted

## 2016-07-02 DIAGNOSIS — Z3689 Encounter for other specified antenatal screening: Secondary | ICD-10-CM

## 2016-07-06 ENCOUNTER — Ambulatory Visit
Admission: RE | Admit: 2016-07-06 | Discharge: 2016-07-06 | Disposition: A | Discharge status: Home / Self Care | Payer: Medicaid Other | Source: Ambulatory Visit | Attending: Obstetrics and Gynecology | Admitting: Obstetrics and Gynecology

## 2016-07-06 DIAGNOSIS — Z3A17 17 weeks gestation of pregnancy: Secondary | ICD-10-CM | POA: Insufficient documentation

## 2016-07-06 DIAGNOSIS — Z3689 Encounter for other specified antenatal screening: Secondary | ICD-10-CM | POA: Insufficient documentation

## 2016-07-06 DIAGNOSIS — O09512 Supervision of elderly primigravida, second trimester: Secondary | ICD-10-CM | POA: Insufficient documentation

## 2016-07-06 DIAGNOSIS — O09529 Supervision of elderly multigravida, unspecified trimester: Secondary | ICD-10-CM

## 2016-07-10 LAB — INFORMASEQ(SM) WITH XY ANALYSIS
FETAL FRACTION (%): 6.6
Fetal Number: 1
GESTATIONAL AGE AT COLLECTION: 12.6 wk
Weight: 159 [lb_av]

## 2016-07-14 LAB — INFORMASEQ(SM) WITH XY ANALYSIS
FETAL FRACTION (%): 6.6
FETAL NUMBER: 1
GESTATIONAL AGE AT COLLECTION: 12.6 wk
Weight: 159 [lb_av]

## 2016-07-16 ENCOUNTER — Ambulatory Visit: Payer: Medicaid Other

## 2016-07-29 LAB — INFORMASEQ(SM) WITH XY ANALYSIS

## 2016-12-08 ENCOUNTER — Inpatient Hospital Stay
Admission: EM | Admit: 2016-12-08 | Discharge: 2016-12-09 | DRG: 775 | Disposition: A | Discharge status: Home / Self Care | Payer: Medicaid Other | Attending: Obstetrics and Gynecology | Admitting: Obstetrics and Gynecology

## 2016-12-08 DIAGNOSIS — O0993 Supervision of high risk pregnancy, unspecified, third trimester: Secondary | ICD-10-CM

## 2016-12-08 DIAGNOSIS — Z3A39 39 weeks gestation of pregnancy: Secondary | ICD-10-CM | POA: Diagnosis not present

## 2016-12-08 DIAGNOSIS — O26893 Other specified pregnancy related conditions, third trimester: Secondary | ICD-10-CM | POA: Diagnosis present

## 2016-12-08 DIAGNOSIS — R030 Elevated blood-pressure reading, without diagnosis of hypertension: Secondary | ICD-10-CM | POA: Diagnosis present

## 2016-12-08 DIAGNOSIS — Z6790 Unspecified blood type, Rh positive: Secondary | ICD-10-CM

## 2016-12-08 DIAGNOSIS — O09529 Supervision of elderly multigravida, unspecified trimester: Secondary | ICD-10-CM

## 2016-12-08 DIAGNOSIS — Z3493 Encounter for supervision of normal pregnancy, unspecified, third trimester: Secondary | ICD-10-CM | POA: Diagnosis present

## 2016-12-08 LAB — COMPREHENSIVE METABOLIC PANEL
ALBUMIN: 3 g/dL — AB (ref 3.5–5.0)
ALT: 19 U/L (ref 14–54)
AST: 31 U/L (ref 15–41)
Alkaline Phosphatase: 176 U/L — ABNORMAL HIGH (ref 38–126)
Anion gap: 6 (ref 5–15)
BILIRUBIN TOTAL: 0.4 mg/dL (ref 0.3–1.2)
BUN: 12 mg/dL (ref 6–20)
CALCIUM: 9.1 mg/dL (ref 8.9–10.3)
CO2: 22 mmol/L (ref 22–32)
CREATININE: 0.61 mg/dL (ref 0.44–1.00)
Chloride: 109 mmol/L (ref 101–111)
GFR calc Af Amer: >60 mL/min (ref >60)
GFR calc non Af Amer: >60 mL/min (ref >60)
GLUCOSE: 79 mg/dL (ref 65–99)
Potassium: 4.3 mmol/L (ref 3.5–5.1)
SODIUM: 137 mmol/L (ref 135–145)
TOTAL PROTEIN: 6.7 g/dL (ref 6.5–8.1)

## 2016-12-08 LAB — CBC
HCT: 36.5 % (ref 35.0–47.0)
Hemoglobin: 12.5 g/dL (ref 12.0–16.0)
MCH: 30.7 pg (ref 26.0–34.0)
MCHC: 34.3 g/dL (ref 32.0–36.0)
MCV: 89.3 fL (ref 80.0–100.0)
PLATELETS: 159 10*3/uL (ref 150–440)
RBC: 4.09 MIL/uL (ref 3.80–5.20)
RDW: 14.6 % — ABNORMAL HIGH (ref 11.5–14.5)
WBC: 10 10*3/uL (ref 3.6–11.0)

## 2016-12-08 LAB — TYPE AND SCREEN
ABO/RH(D): O POS
Antibody Screen: NEGATIVE

## 2016-12-08 MED ORDER — DIBUCAINE 1 % RE OINT: 1.0000 "application " | TOPICAL_OINTMENT | RECTAL | Status: DC | PRN

## 2016-12-08 MED ORDER — LIDOCAINE HCL (PF) 1 % IJ SOLN
INTRAMUSCULAR | Status: AC
  Filled 2016-12-08: qty 30

## 2016-12-08 MED ORDER — SODIUM CHLORIDE 0.9% FLUSH: 3.0000 mL | Freq: Two times a day (BID) | INTRAVENOUS | Status: DC

## 2016-12-08 MED ORDER — OXYTOCIN BOLUS FROM INFUSION
500.0000 mL | Freq: Once | INTRAVENOUS | Status: AC
  Administered 2016-12-08: 500 mL via INTRAVENOUS

## 2016-12-08 MED ORDER — ONDANSETRON HCL 4 MG PO TABS: 4.0000 mg | ORAL_TABLET | ORAL | Status: DC | PRN

## 2016-12-08 MED ORDER — IBUPROFEN 600 MG PO TABS
ORAL_TABLET | ORAL | Status: AC
  Filled 2016-12-08: qty 1

## 2016-12-08 MED ORDER — ACETAMINOPHEN 325 MG PO TABS: 650.0000 mg | ORAL_TABLET | ORAL | Status: DC | PRN

## 2016-12-08 MED ORDER — FLEET ENEMA 7-19 GM/118ML RE ENEM: 1.0000 | ENEMA | Freq: Every day | RECTAL | Status: DC | PRN

## 2016-12-08 MED ORDER — DIPHENHYDRAMINE HCL 25 MG PO CAPS: 25.0000 mg | ORAL_CAPSULE | Freq: Four times a day (QID) | ORAL | Status: DC | PRN

## 2016-12-08 MED ORDER — LACTATED RINGERS IV SOLN: 500.0000 mL | INTRAVENOUS | Status: DC | PRN

## 2016-12-08 MED ORDER — OXYTOCIN 40 UNITS IN LACTATED RINGERS INFUSION - SIMPLE MED
INTRAVENOUS | Status: AC
  Filled 2016-12-08: qty 1000

## 2016-12-08 MED ORDER — SENNOSIDES-DOCUSATE SODIUM 8.6-50 MG PO TABS
2.0000 | ORAL_TABLET | ORAL | Status: DC
  Administered 2016-12-08: 2 via ORAL
  Filled 2016-12-08: qty 2

## 2016-12-08 MED ORDER — ONDANSETRON HCL 4 MG/2ML IJ SOLN: 4.0000 mg | INTRAMUSCULAR | Status: DC | PRN

## 2016-12-08 MED ORDER — SIMETHICONE 80 MG PO CHEW: 80.0000 mg | CHEWABLE_TABLET | ORAL | Status: DC | PRN

## 2016-12-08 MED ORDER — LIDOCAINE HCL (PF) 1 % IJ SOLN
30.0000 mL | INTRAMUSCULAR | Status: DC | PRN
Start: 2016-12-08 — End: 2016-12-09
  Administered 2016-12-08: 30 mL via SUBCUTANEOUS

## 2016-12-08 MED ORDER — PRENATAL MULTIVITAMIN CH
1.0000 | ORAL_TABLET | Freq: Every day | ORAL | Status: DC
  Filled 2016-12-08: qty 1

## 2016-12-08 MED ORDER — BUTORPHANOL TARTRATE 2 MG/ML IJ SOLN
INTRAMUSCULAR | Status: AC
  Filled 2016-12-08: qty 1

## 2016-12-08 MED ORDER — TETANUS-DIPHTH-ACELL PERTUSSIS 5-2.5-18.5 LF-MCG/0.5 IM SUSP: 0.5000 mL | Freq: Once | INTRAMUSCULAR | Status: DC

## 2016-12-08 MED ORDER — COCONUT OIL OIL: 1.0000 "application " | TOPICAL_OIL | Status: DC | PRN

## 2016-12-08 MED ORDER — BISACODYL 10 MG RE SUPP: 10.0000 mg | Freq: Every day | RECTAL | Status: DC | PRN

## 2016-12-08 MED ORDER — BUTORPHANOL TARTRATE 2 MG/ML IJ SOLN
1.0000 mg | Freq: Once | INTRAMUSCULAR | Status: AC
  Administered 2016-12-08: 1 mg via INTRAVENOUS

## 2016-12-08 MED ORDER — SOD CITRATE-CITRIC ACID 500-334 MG/5ML PO SOLN: 30.0000 mL | ORAL | Status: DC | PRN

## 2016-12-08 MED ORDER — AMMONIA AROMATIC IN INHA
RESPIRATORY_TRACT | Status: AC
  Filled 2016-12-08: qty 10

## 2016-12-08 MED ORDER — BENZOCAINE-MENTHOL 20-0.5 % EX AERO: 1.0000 "application " | INHALATION_SPRAY | CUTANEOUS | Status: DC | PRN

## 2016-12-08 MED ORDER — SODIUM CHLORIDE 0.9% FLUSH: 3.0000 mL | INTRAVENOUS | Status: DC | PRN

## 2016-12-08 MED ORDER — LACTATED RINGERS IV SOLN
INTRAVENOUS | Status: DC
  Administered 2016-12-08: 12:00:00 via INTRAVENOUS

## 2016-12-08 MED ORDER — BUTORPHANOL TARTRATE 1 MG/ML IJ SOLN: 1.0000 mg | INTRAMUSCULAR | Status: DC | PRN

## 2016-12-08 MED ORDER — WITCH HAZEL-GLYCERIN EX PADS: 1.0000 "application " | MEDICATED_PAD | CUTANEOUS | Status: DC | PRN

## 2016-12-08 MED ORDER — MISOPROSTOL 200 MCG PO TABS
ORAL_TABLET | ORAL | Status: AC
  Filled 2016-12-08: qty 4

## 2016-12-08 MED ORDER — MEASLES, MUMPS & RUBELLA VAC ~~LOC~~ INJ
0.5000 mL | INJECTION | Freq: Once | SUBCUTANEOUS | Status: DC
  Filled 2016-12-08: qty 0.5

## 2016-12-08 MED ORDER — ONDANSETRON HCL 4 MG/2ML IJ SOLN: 4.0000 mg | Freq: Four times a day (QID) | INTRAMUSCULAR | Status: DC | PRN

## 2016-12-08 MED ORDER — OXYTOCIN 40 UNITS IN LACTATED RINGERS INFUSION - SIMPLE MED: 2.5000 [IU]/h | INTRAVENOUS | Status: DC

## 2016-12-08 MED ORDER — ZOLPIDEM TARTRATE 5 MG PO TABS: 5.0000 mg | ORAL_TABLET | Freq: Every evening | ORAL | Status: DC | PRN

## 2016-12-08 MED ORDER — SODIUM CHLORIDE 0.9 % IV SOLN: 250.0000 mL | INTRAVENOUS | Status: DC | PRN

## 2016-12-08 MED ORDER — OXYTOCIN 10 UNIT/ML IJ SOLN
INTRAMUSCULAR | Status: AC
  Filled 2016-12-08: qty 2

## 2016-12-08 MED ORDER — LACTATED RINGERS IV SOLN: INTRAVENOUS | Status: DC

## 2016-12-08 MED ORDER — IBUPROFEN 600 MG PO TABS
600.0000 mg | ORAL_TABLET | Freq: Four times a day (QID) | ORAL | Status: DC
  Administered 2016-12-08 – 2016-12-09 (×3): 600 mg via ORAL
  Filled 2016-12-08 (×2): qty 1

## 2016-12-08 NOTE — Lactation Note (Signed)
This note was copied from a baby's chart. Lactation Consultation Note  Patient Name: Brianna Conner BJYNW'GToday's Date: 12/08/2016 Reason for consult: Initial assessment   Maternal Data    Feeding Feeding Type: Breast Fed  LATCH Score/Interventions Latch: Repeated attempts needed to sustain latch, nipple held in mouth throughout feeding, stimulation needed to elicit sucking reflex. Intervention(s): Adjust position;Assist with latch  Audible Swallowing: A few with stimulation Intervention(s): Hand expression;Skin to skin  Type of Nipple: Everted at rest and after stimulation  Comfort (Breast/Nipple): Soft / non-tender     Hold (Positioning): Assistance needed to correctly position infant at breast and maintain latch. Intervention(s): Breastfeeding basics reviewed;Skin to skin  LATCH Score: 7  Lactation Tools Discussed/Used     Consult Status Consult Status: Follow-up Date: 12/08/16 Follow-up type: In-patient Mom's now 6yo was born 8 wks premature and didn't feed well in NICU.  Parents are concerned that this baby won't eat well and will lose wt fast so they insist on pumping and bottlefeeding. Spoke with parents about the difference between a term and preterm baby and breastfeeding with interpreter, but parents still desire to move forward with plan. Mom also asked about fenugreek because she took it last time and it helped.    Brianna Conner 12/08/2016, 2:26 PM

## 2016-12-08 NOTE — H&P (Signed)
OB ADMISSION/ HISTORY & PHYSICAL:  Admission Date: 12/08/2016 10:42 AM  Admit Diagnosis: Active labor  Brianna Conner is a 36 y.o. female presenting for active labor with advanced dilationy.  Prenatal History: G3P1011   EDC : 12/13/2016, by Patient Reported  Prenatal care at [facility] Primary Ob Provider: [facility] Prenatal course complicated by Depression, GAD, AMA, neg prenatal screening  Prenatal Labs: ABO, Rh:  O pos Antibody:  negative Rubella:    RPR:    HBsAg:    HIV:    GTT: 130 GBS:   negative  Medical / Surgical History :  Past medical history:  Past Medical History:  Diagnosis Date  . Medical history non-contributory      Past surgical history:  Past Surgical History:  Procedure Laterality Date  . arm surgery  Right     Family History: History reviewed. No pertinent family history.   Social History:  reports that she has never smoked. She has never used smokeless tobacco. She reports that she does not drink alcohol or use drugs.   Allergies: Patient has no known allergies.    Current Medications at time of admission:  Prior to Admission medications   Medication Sig Start Date End Date Taking? Authorizing Provider  Prenatal Vit-Fe Fumarate-FA (MULTIVITAMIN-PRENATAL) 27-0.8 MG TABS tablet Take 1 tablet by mouth daily at 12 noon.   Yes [provider]     Review of Systems: Active FM onset of ctx @ 700a currently every 2 minutes LOF  / SROM @ 1210 clear fluid bloody show yes   Physical Exam:  VS: Blood pressure (!) 144/94, pulse 92, temperature 98 F (36.7 C), temperature source Oral, resp. rate 18, height 5\' 2"  (1.575 m), weight 190 lb (86.2 kg), unknown if currently breastfeeding.  General: alert and oriented, appears NAD Heart: RRR Lungs: Clear lung fields Abdomen: Gravid, soft and non-tender, non-distended  Extremities: 2+ edema  Genitalia / VE: Dilation: 8 Effacement (%): 90 Station: +1 Exam by:: Dalbert GarnetBeasley MD  FHR: baseline  rate 140 / variability mod / accelerations + / no decelerations TOCO: q2  Last US Anatomy scan with MFM, placenta  above the os, AF wnl, normal anatomy  Assessment: 39+[redacted] weeks gestation by LMP and 17wk scan 1 stage of labor FHR category 1  Plan:   Admit for active labor Labs pending Epidural when desired Continuous fetal monitoring   1. Fetal Well being  - Fetal Tracing: Cat 1 - Ultrasound:  reviewed, as above - Group B Streptococcus: neg - Presentation: vtx confirmed by sutures   2. Routine OB: - Prenatal labs reviewed, as above - Rh pos  3. Elevated BP, labs sent, BP taken during contractions

## 2016-12-08 NOTE — Discharge Summary (Signed)
Obstetrical Discharge Summary  Patient Name: Brianna Conner DOB: 11-12-1980 MRN: [id number]  Date of Admission: 12/08/2016 Date of Discharge: 12/08/2016  Primary OB: [facility]  Gestational Age at Delivery: 7520w2d   Antepartum complications: Depression, GAD, AMA, neg prenatal screening Admitting Diagnosis: active Secondary Diagnosis: Patient Active Problem List   Diagnosis Date Noted  . Supervision of high risk pregnancy in third trimester 12/08/2016  . Elderly multigravida 05/14/2016  . use u/s dates [facility] 05/14/2016    Augmentation: None Complications: NOne Intrapartum complications/course:  G3P1011 at 39+2wks by LMP and 17wk ultrasound presented in active labor and quickly progressed to fully dilated. She pushed over an intact perineum without anesthisea and delivered a viable healthy female infant with a short umbilical cord, but no nuchal cord. Cord clamped and cut by FOB at the perineum and baby passed to maternal abdomen. He was vigorous and crying immediately. Small left labial laceration and left hymenal laceration repaired under local anesthesia with simple interrupted stiches x4. Placenta delivered intact and fundus firm. Postpartum pitocin running.  Date of Delivery: 12/08/16 Delivered By: Christeen DouglasBethany Beasley  Delivery Type: spontaneous vaginal delivery Anesthesia: local Placenta: Spontaneous Laceration: left labial, left hymenal  Episiotomy: none Newborn Data: Live born female "Lajandro" Birth Weight: 7 lb 14.6 oz (3590 g) APGAR: 8, 9    Discharge Physical Exam:  BP 128/69   Pulse 85   Temp 98 F (36.7 C) (Oral)   Resp 18   Ht 5\' 2"  (1.575 m)   Wt 190 lb (86.2 kg)   Breastfeeding? Unknown   BMI 34.75 kg/m   General: NAD CV: RRR Pulm: CTABL, nl effort ABD: s/nd/nt, fundus firm and below the umbilicus Lochia: moderate Incision: c/d/i  DVT Evaluation: LE non-ttp, no evidence of DVT on exam.  Hemoglobin  Date Value Ref Range Status  12/08/2016 12.5  12.0 - 16.0 g/dL Final   HCT  Date Value Ref Range Status  12/08/2016 36.5 35.0 - 47.0 % Final    Post partum course:Stable  Postpartum Procedures: None Disposition: stable, discharge to home. Baby Feeding: breastmilk and formula Baby Disposition: home with mom  Rh Immune globulin given: n/a Rubella vaccine given: n/a- immune Tdap vaccine given in AP or PP setting: n/a Flu vaccine given in AP or PP setting: n/a  Contraception: OCP's at 6 weeks   Prenatal Labs:  O pos, HIV NR, RPR NR, GBS Neg, RI, VI,   Plan:  Brianna Conner was discharged to home in good condition. [facility] with delivering provider in 6 weeks  Discharge Medications: PNV, Fe    Signed: Sharee Pimplearon W. Jones, RN, MSN, CNM, FNP

## 2016-12-09 LAB — CBC
HEMATOCRIT: 33.2 % — AB (ref 35.0–47.0)
Hemoglobin: 11.6 g/dL — ABNORMAL LOW (ref 12.0–16.0)
MCH: 31.8 pg (ref 26.0–34.0)
MCHC: 35 g/dL (ref 32.0–36.0)
MCV: 90.7 fL (ref 80.0–100.0)
PLATELETS: 130 10*3/uL — AB (ref 150–440)
RBC: 3.66 MIL/uL — AB (ref 3.80–5.20)
RDW: 14.4 % (ref 11.5–14.5)
WBC: 10.5 10*3/uL (ref 3.6–11.0)

## 2016-12-09 LAB — RPR: RPR Ser Ql: NONREACTIVE

## 2016-12-09 NOTE — Progress Notes (Signed)
Reviewed all patients discharge instructions and handouts regarding postpartum bleeding, no intercourse for 6 weeks, signs and symptoms of mastitis and postpartum bleu's. Reviewed discharge instructions for newborn regarding proper cord care, how and when to bathe the newborn, nail care, proper way to take the baby's temperature, along with safe sleep. All questions have been answered at this time. Discharge instructions reviewed via interpreter Hiram (at the bedside).Patient discharged via wheelchair with axillary.

## 2016-12-09 NOTE — Progress Notes (Signed)
Post Partum Day 1 Subjective: "I want to go home today" via the Interperter Otto  Objective: Blood pressure (!) 112/55, pulse 90, temperature 98.1 F (36.7 C), temperature source Oral, resp. rate 16, height 5\' 2"  (1.575 m), weight 190 lb (86.2 kg), SpO2 99 %, unknown if currently breastfeeding.  Physical Exam:  General: alert and cooperative  Lungs: CTA bilat, no W/R/R. Heart: S1S2, RRR, NO M/R/G Lochia: appropriate Uterine Fundus: firm, U-2,  DVT Evaluation: Neg Homans   Recent Labs  12/08/16 1151 12/09/16 0656  HGB 12.5 11.6*  HCT 36.5 33.2*  WBC 10.0 10.5  PLT 159 130*    Assessment/Plan: A: ppd#1 STABLE 2. Elevated BP's in labor/Now stable P: 1. Dc today 2. FU at [facility] at 6 weeks pp for contraception   LOS: 1 day   Sharee Pimplearon W Fedra Lanter 12/09/2016, 10:07 AM

## 2018-11-07 ENCOUNTER — Telehealth: Payer: Self-pay

## 2018-11-07 DIAGNOSIS — Z20822 Contact with and (suspected) exposure to covid-19: Secondary | ICD-10-CM

## 2018-11-07 NOTE — Telephone Encounter (Signed)
Telphone call from Mel[place]  From [facility] Dept.  Referring Patient for the covid 19 testing.   Telephone call to Pt.  Scheduled for Tueday June 2,2020 patient voices understanding.  Interpreter [phone]

## 2018-11-08 ENCOUNTER — Other Ambulatory Visit: Payer: Self-pay

## 2018-11-08 DIAGNOSIS — Z20822 Contact with and (suspected) exposure to covid-19: Secondary | ICD-10-CM

## 2018-11-09 ENCOUNTER — Telehealth: Payer: Self-pay | Admitting: Hematology

## 2018-11-09 LAB — NOVEL CORONAVIRUS, NAA: SARS-CoV-2, NAA: DETECTED — AB

## 2018-11-09 NOTE — Telephone Encounter (Signed)
Utilized spanish interpreter and called patient to inform her of her positive COVID result. / Patient states she is feeling fine now and does not have any symptoms. / Advised patient on the criteria for ending self-isolation.  Provided home instructions. / Message left on [facility] secure voicemail

## 2021-09-02 ENCOUNTER — Other Ambulatory Visit: Payer: Self-pay | Admitting: Family Medicine

## 2021-09-02 ENCOUNTER — Other Ambulatory Visit (HOSPITAL_COMMUNITY): Payer: Self-pay | Admitting: Family Medicine

## 2021-09-02 DIAGNOSIS — N938 Other specified abnormal uterine and vaginal bleeding: Secondary | ICD-10-CM

## 2021-09-02 DIAGNOSIS — N761 Subacute and chronic vaginitis: Secondary | ICD-10-CM

## 2021-09-11 ENCOUNTER — Ambulatory Visit: Payer: Self-pay

## 2021-09-19 ENCOUNTER — Ambulatory Visit
Admission: RE | Admit: 2021-09-19 | Discharge: 2021-09-19 | Disposition: A | Discharge status: Home / Self Care | Payer: Self-pay | Source: Ambulatory Visit | Attending: Family Medicine | Admitting: Family Medicine

## 2021-09-19 DIAGNOSIS — N938 Other specified abnormal uterine and vaginal bleeding: Secondary | ICD-10-CM | POA: Insufficient documentation

## 2021-09-19 DIAGNOSIS — N761 Subacute and chronic vaginitis: Secondary | ICD-10-CM | POA: Insufficient documentation

## 2022-05-15 ENCOUNTER — Ambulatory Visit: Payer: Self-pay

## 2023-06-13 IMAGING — US US PELVIS COMPLETE WITH TRANSVAGINAL
1 series · 14 of 25 positions shown · non-contrast
Comparison: None

CLINICAL DATA: Abnormal uterine and vaginal bleeding, subacute
vaginitis, LMP 09/18/2021

EXAM:
TRANSABDOMINAL AND TRANSVAGINAL ULTRASOUND OF PELVIS
TECHNIQUE: Both transabdominal and transvaginal ultrasound examinations of the
pelvis were performed. Transabdominal technique was performed for
global imaging of the pelvis including uterus, ovaries, adnexal
regions, and pelvic cul-de-sac. It was necessary to proceed with
endovaginal exam following the transabdominal exam to visualize the
endometrium and adnexa.

[Series 1: us pelvic complete with transvaginal · 14 of 112 slices shown]
[im 1/112]
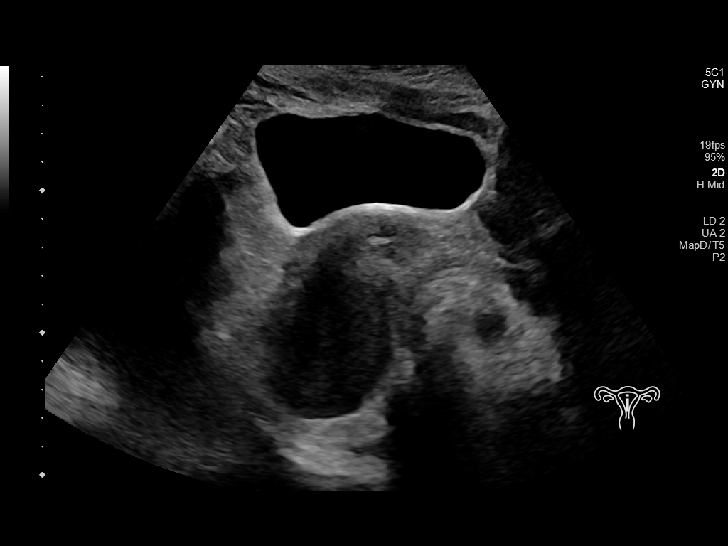
[im 10/112]
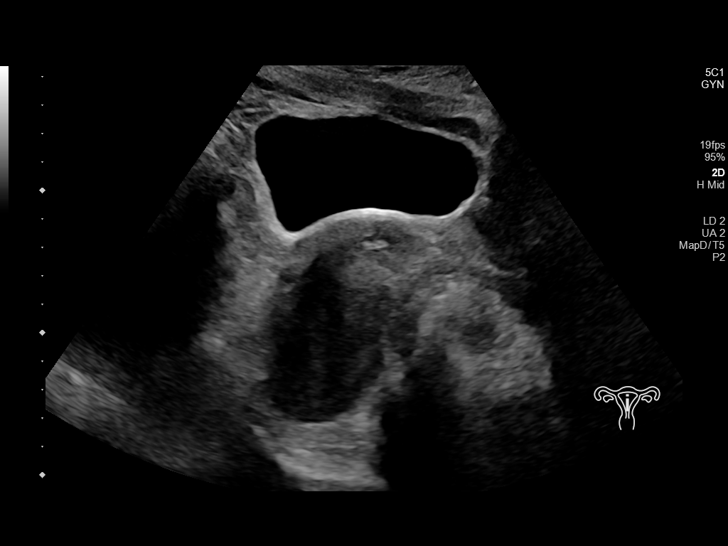
[im 19/112]
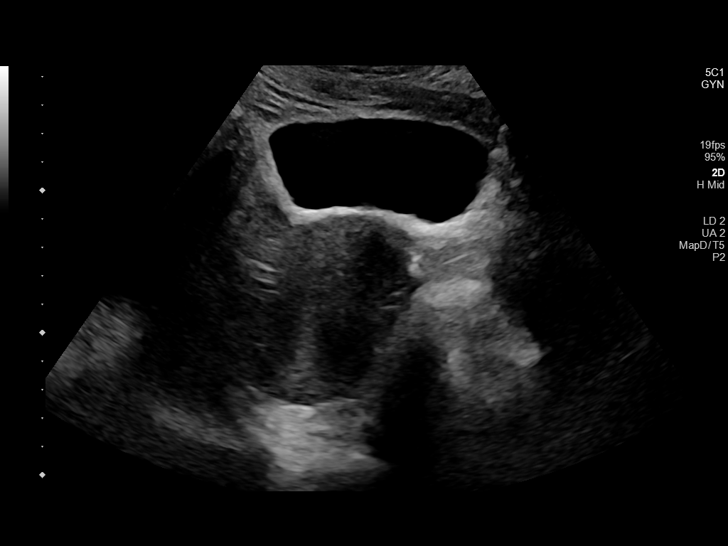
[im 28/112]
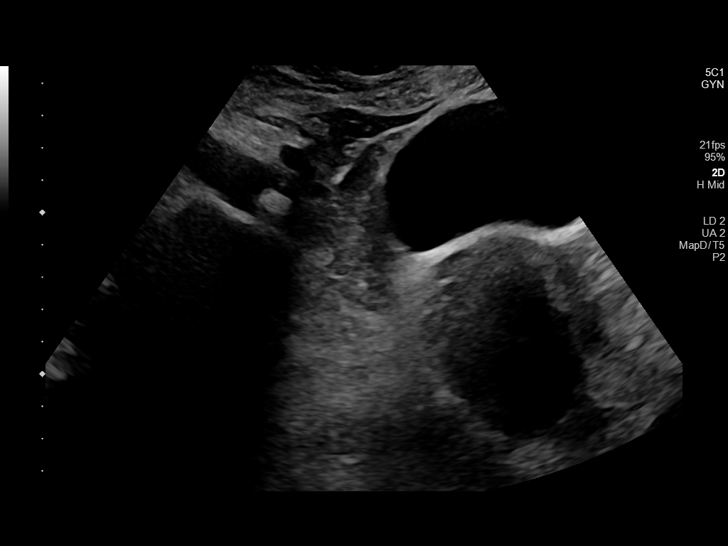
[im 38/112]
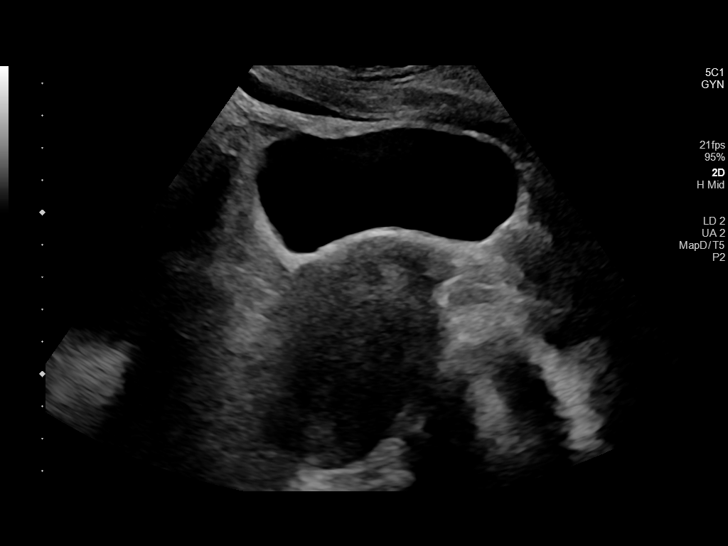
[im 42/112]
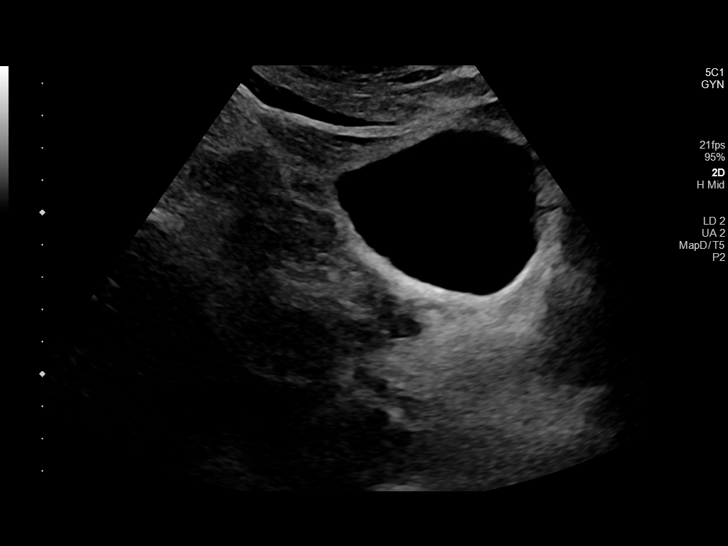
[im 51/112]
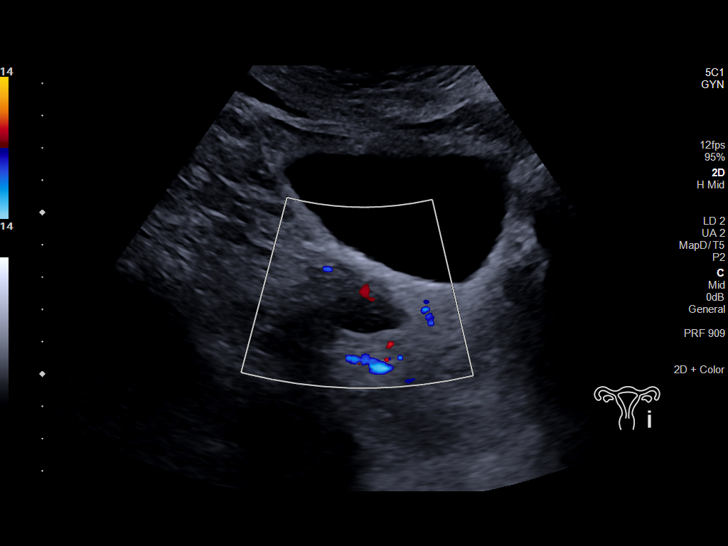
[im 61/112]
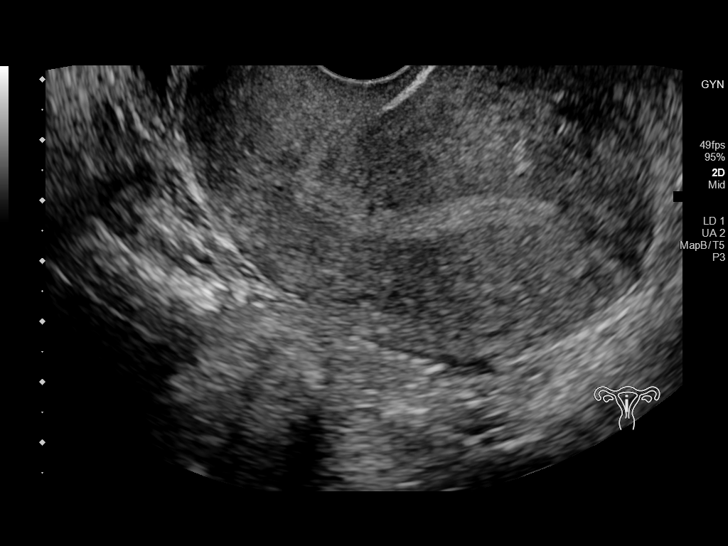
[im 70/112]
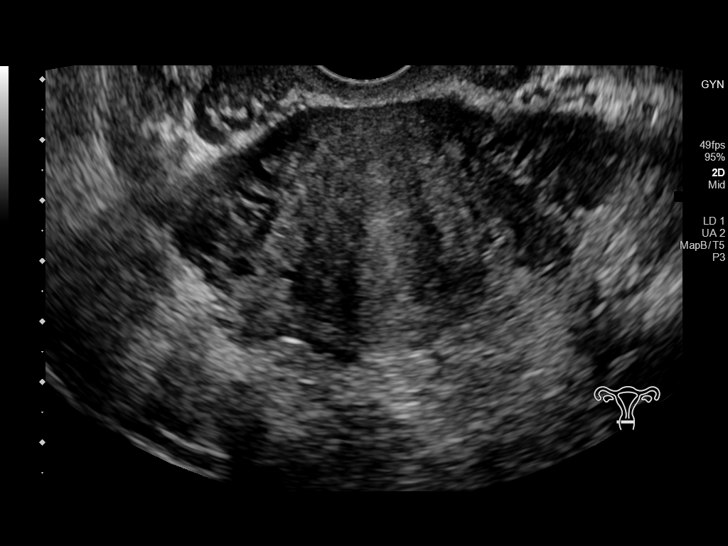
[im 75/112]
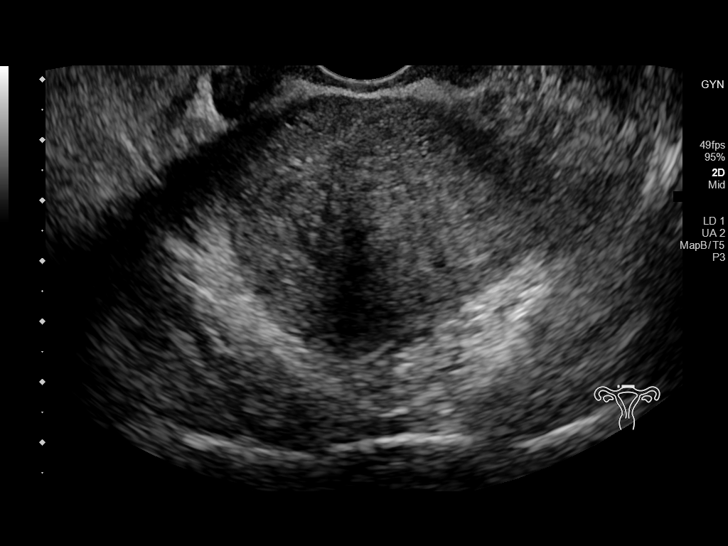
[im 84/112]
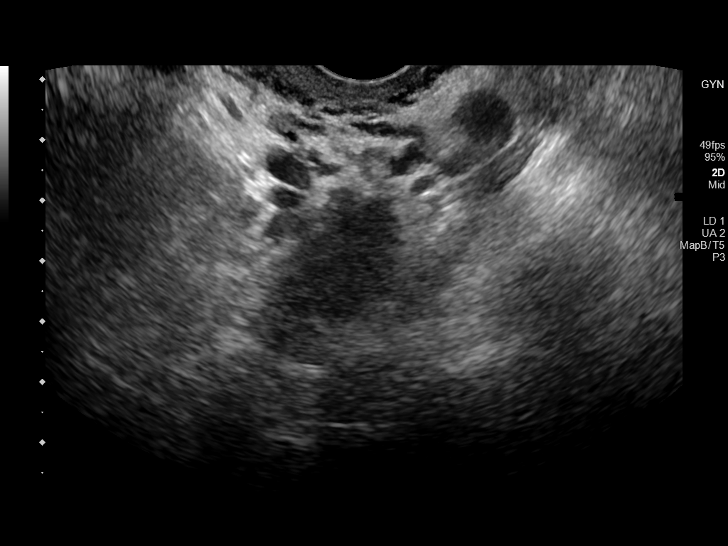
[im 93/112]
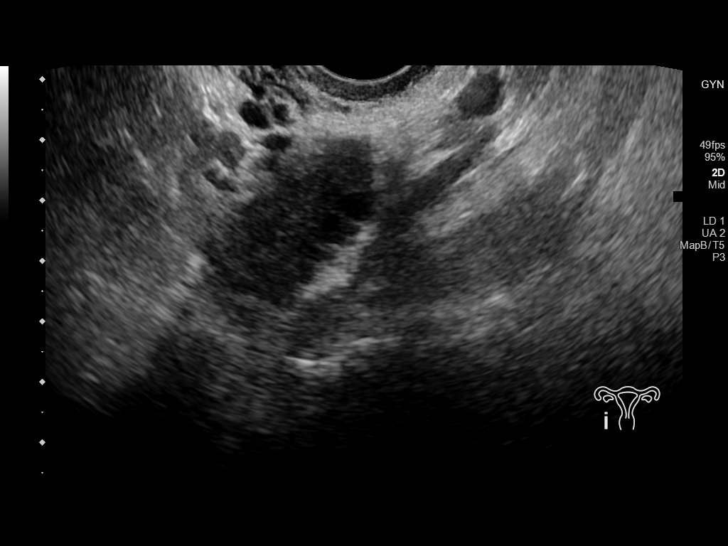
[im 102/112]
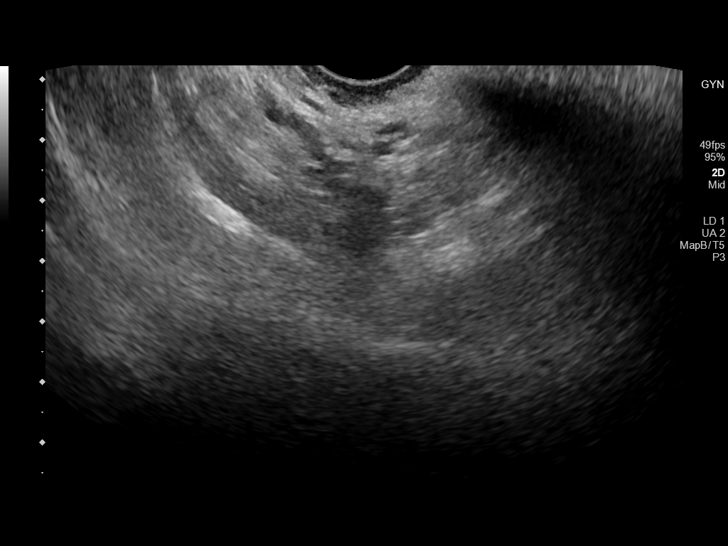
[im 112/112]
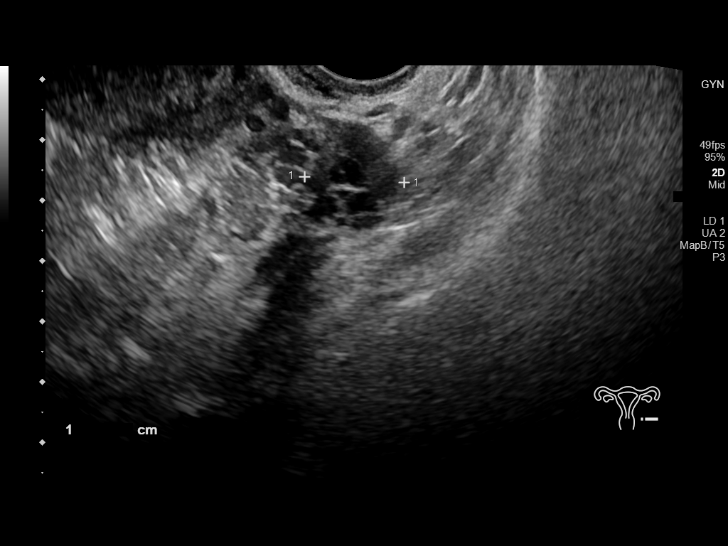

[14 of 25 positions shown; findings below may reference images not displayed]

FINDINGS: Uterus

Measurements: 6.7 x 5.2 x 6.8 cm = volume: 116 mL. Retroverted.
Normal morphology without mass

Endometrium

Thickness: 5 mm.  No endometrial fluid or mass

Right ovary

Measurements: 3.0 x 1.7 x 2.6 cm = volume: 7 mL. Normal morphology
without mass

Left ovary

Measurements: 2.9 x 2.1 x 1.7 cm = volume: 5.4 mL. Normal morphology
without mass

Other findings

No free pelvic fluid.  No adnexal masses.
IMPRESSION: Normal exam.
